# Patient Record
Sex: Male | Born: 1995 | Race: White | Hispanic: No | Marital: Single | Smoking: Current some day smoker
Health system: Southern US, Community
[De-identification: ages and names within clinical notes are randomized; demographics above are authoritative.]

## PROBLEM LIST (undated history)

## (undated) HISTORY — PX: FOOT SURGERY: SHX648

---
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
## 2022-09-19 ENCOUNTER — Emergency Department (HOSPITAL_COMMUNITY)
Admission: EM | Admit: 2022-09-19 | Discharge: 2022-09-19 | Disposition: A | Discharge status: Home / Self Care | Payer: Self-pay | Attending: Emergency Medicine | Admitting: Emergency Medicine

## 2022-09-19 ENCOUNTER — Emergency Department (HOSPITAL_COMMUNITY): Payer: Self-pay

## 2022-09-19 ENCOUNTER — Other Ambulatory Visit: Payer: Self-pay

## 2022-09-19 ENCOUNTER — Encounter (HOSPITAL_COMMUNITY): Payer: Self-pay

## 2022-09-19 DIAGNOSIS — Y92009 Unspecified place in unspecified non-institutional (private) residence as the place of occurrence of the external cause: Secondary | ICD-10-CM | POA: Insufficient documentation

## 2022-09-19 DIAGNOSIS — S61411A Laceration without foreign body of right hand, initial encounter: Secondary | ICD-10-CM | POA: Insufficient documentation

## 2022-09-19 DIAGNOSIS — Z23 Encounter for immunization: Secondary | ICD-10-CM | POA: Insufficient documentation

## 2022-09-19 DIAGNOSIS — W25XXXA Contact with sharp glass, initial encounter: Secondary | ICD-10-CM | POA: Insufficient documentation

## 2022-09-19 DIAGNOSIS — Y93E5 Activity, floor mopping and cleaning: Secondary | ICD-10-CM | POA: Insufficient documentation

## 2022-09-19 MED ORDER — TETANUS-DIPHTH-ACELL PERTUSSIS 5-2.5-18.5 LF-MCG/0.5 IM SUSY
0.5000 mL | PREFILLED_SYRINGE | Freq: Once | INTRAMUSCULAR | Status: AC
  Administered 2022-09-19: 0.5 mL via INTRAMUSCULAR
  Filled 2022-09-19: qty 0.5

## 2022-09-19 MED ORDER — HYDROCODONE-ACETAMINOPHEN 5-325 MG PO TABS
1.0000 | ORAL_TABLET | Freq: Once | ORAL | Status: DC
  Filled 2022-09-19: qty 1

## 2022-09-19 MED ORDER — LIDOCAINE HCL (PF) 1 % IJ SOLN
10.0000 mL | Freq: Once | INTRAMUSCULAR | Status: DC
  Filled 2022-09-19: qty 30

## 2022-09-19 NOTE — Discharge Instructions (Signed)
As we discussed, your work-up in the ER today was reassuring for acute findings.  X-ray imaging did not show any signs of retained glass in your hand.  You could potentially still have small pieces of glass in your hand which would not show up on x-ray, however they should work their way out on their own.  We have placed Dermabond on your wound today.  They should peel off on its own in the next week.  Please monitor for any signs of infection as this would be reason to return for further evaluation.  Return if development of any new or worsening symptoms.

## 2022-09-19 NOTE — ED Triage Notes (Signed)
Pt c/o right hand laceration after cutting it on glass. Pt states he feels like he may have hit an artery due to the blood spurting out immediately afterwards. Pt has a clean and dry dressing applied to right hand at this time.

## 2022-09-19 NOTE — ED Provider Notes (Signed)
[facility]-EMERGENCY DEPT Provider Note   CSN: [id number] Arrival date & time: 09/19/22  1912     History  Chief Complaint  Patient presents with   Right Hand Laceration    Kyle Warner is a 26 y.o. male.  Patient with no pertinent past medical history presents today with complaints of right hand laceration. He states that same occurred immediately prior to arrival today when he was cleaning out his house and accidentally grabbed something with broken glass. States that the glass punctured his right palm and there was significant bleeding he was unable to get controlled at home. Presents for same. Unknown last tetanus.  The history is provided by the patient. No language interpreter was used.       Home Medications Prior to Admission medications   Not on File      Allergies    Coconut flavor    Review of Systems   Review of Systems  Skin:  Positive for wound.  All other systems reviewed and are negative.   Physical Exam Updated Vital Signs BP 122/62   Pulse 94   Temp 99.6 F (37.6 C) (Oral)   Resp 16   Ht 5\' 11"  (1.803 m)   Wt 93 kg   SpO2 96%   BMI 28.59 kg/m  Physical Exam Vitals and nursing note reviewed.  Constitutional:      General: He is not in acute distress.    Appearance: Normal appearance. He is normal weight. He is not ill-appearing, toxic-appearing or diaphoretic.  HENT:     Head: Normocephalic and atraumatic.  Cardiovascular:     Rate and Rhythm: Normal rate.  Pulmonary:     Effort: Pulmonary effort is normal. No respiratory distress.  Musculoskeletal:        General: Normal range of motion.     Cervical back: Normal range of motion.  Skin:    General: Skin is warm and dry.     Comments: Small puncture wound noted to the right palm with no active bleeding. No signs of foreign body. ROM intact. Capillary refill less than 2 seconds. Radial pulses intact and 2+. Compartments soft.  Neurological:     General: No focal  deficit present.     Mental Status: He is alert.  Psychiatric:        Mood and Affect: Mood normal.        Behavior: Behavior normal.     ED Results / Procedures / Treatments   Labs (all labs ordered are listed, but only abnormal results are displayed) Labs Reviewed - No data to display  EKG None  Radiology DG Hand Complete Right  Result Date: 09/19/2022 CLINICAL DATA:  right palm injury.  Laceration glass EXAM: RIGHT HAND - COMPLETE 3+ VIEW COMPARISON:  None Available. FINDINGS: There is no evidence of fracture or dislocation. There is no evidence of arthropathy or other focal bone abnormality. Soft tissues are unremarkable. No retained radiopaque foreign body. IMPRESSION: Negative. Electronically Signed   By: 09/21/2022 M.D.   On: 09/19/2022 22:27    Procedures .10/28/2023Laceration Repair  Date/Time: 09/19/2022 11:01 PM  Performed by: 09/21/2022, PA-C Authorized by: Silva Bandy, PA-C   Consent:    Consent obtained:  Verbal   Consent given by:  Patient   Risks, benefits, and alternatives were discussed: yes     Risks discussed:  Infection, pain, retained foreign body, tendon damage, vascular damage, poor wound healing, poor cosmetic result, need for additional repair  and nerve damage   Alternatives discussed:  No treatment, observation, delayed treatment and referral Universal protocol:    Procedure explained and questions answered to patient or proxy's satisfaction: yes     Imaging studies available: yes     Patient identity confirmed:  Verbally with patient Anesthesia:    Anesthesia method:  None Laceration details:    Location:  Hand   Hand location:  R palm   Length (cm):  2   Depth (mm):  2 Exploration:    Imaging obtained: x-ray     Imaging outcome: foreign body not noted     Wound exploration: wound explored through full range of motion and entire depth of wound visualized   Treatment:    Area cleansed with:  Soap and water   Amount of cleaning:   Standard   Irrigation solution:  Sterile saline   Irrigation volume:  500 ml   Irrigation method:  Pressure wash Skin repair:    Repair method:  Tissue adhesive Repair type:    Repair type:  Simple Post-procedure details:    Dressing:  Non-adherent dressing and sterile dressing   Procedure completion:  Tolerated well, no immediate complications     Medications Ordered in ED Medications  lidocaine (PF) (XYLOCAINE) 1 % injection 10 mL (has no administration in time range)  Tdap (BOOSTRIX) injection 0.5 mL (0.5 mLs Intramuscular Given 09/19/22 2218)  HYDROcodone-acetaminophen (NORCO/VICODIN) 5-325 MG per tablet 1 tablet (1 tablet Oral Given 09/19/22 2217)    ED Course/ Medical Decision Making/ A&P                           Medical Decision Making Amount and/or Complexity of Data Reviewed Radiology: ordered.  Risk Prescription drug management.   Patient presents today with complaints of right palm laceration.  He is afebrile, nontoxic-appearing, and in no acute distress with reassuring vital signs.  Pressure irrigation performed of wound. Wound explored and base of wound visualized in a bloodless field without evidence of foreign body.  X-ray imaging obtained which was reassuring for acute findings.  I have personally reviewed and interpreted this imaging and agree with radiology interpretation.  Laceration occurred < 8 hours prior to repair.  Offered repair with sutures given patient's description of the amount of bleeding that occurred and concern for rebleeding, patient would prefer repair with dermabond. Given no active bleeding, this is reasonable. Wound repair well tolerated. Tdap updated.  Pt has no comorbidities to effect normal wound healing. Pt discharged without antibiotics.  Discussed suture home care with patient and answered questions. Pt to follow-up for wound check in 7 days; they are to return to the ED sooner for signs of infection. Pt is hemodynamically stable with no  complaints prior to dc.    Final Clinical Impression(s) / ED Diagnoses Final diagnoses:  Laceration of right palm, initial encounter    Rx / DC Orders ED Discharge Orders     None     An After Visit Summary was printed and given to the patient.     Nestor Lewandowsky 09/19/22 2308    Ezequiel Essex, MD 09/20/22 [phone]
